# Patient Record
Sex: Female | Born: 2005 | Race: Black or African American | Hispanic: No | Marital: Single | Smoking: Never smoker
Health system: Southern US, Community
[De-identification: ages and names within clinical notes are randomized; demographics above are authoritative.]

## PROBLEM LIST (undated history)

## (undated) DIAGNOSIS — D573 Sickle-cell trait: Secondary | ICD-10-CM

## (undated) DIAGNOSIS — H5509 Other forms of nystagmus: Secondary | ICD-10-CM

---
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
## 2024-01-03 ENCOUNTER — Ambulatory Visit (HOSPITAL_COMMUNITY)
Admission: EM | Admit: 2024-01-03 | Discharge: 2024-01-03 | Disposition: A | Discharge status: Home / Self Care | Payer: Medicaid Other | Attending: Family Medicine | Admitting: Family Medicine

## 2024-01-03 ENCOUNTER — Encounter (HOSPITAL_COMMUNITY): Payer: Self-pay | Admitting: Emergency Medicine

## 2024-01-03 DIAGNOSIS — S61210A Laceration without foreign body of right index finger without damage to nail, initial encounter: Secondary | ICD-10-CM

## 2024-01-03 DIAGNOSIS — Z203 Contact with and (suspected) exposure to rabies: Secondary | ICD-10-CM | POA: Diagnosis not present

## 2024-01-03 MED ORDER — TETANUS-DIPHTH-ACELL PERTUSSIS 5-2.5-18.5 LF-MCG/0.5 IM SUSY
PREFILLED_SYRINGE | INTRAMUSCULAR | Status: AC
  Filled 2024-01-03: qty 0.5

## 2024-01-03 MED ORDER — TETANUS-DIPHTH-ACELL PERTUSSIS 5-2.5-18.5 LF-MCG/0.5 IM SUSY
0.5000 mL | PREFILLED_SYRINGE | Freq: Once | INTRAMUSCULAR | Status: AC
  Administered 2024-01-03: 0.5 mL via INTRAMUSCULAR

## 2024-01-03 MED ORDER — TETANUS-DIPHTHERIA TOXOIDS TD 5-2 LFU IM INJ: 0.5000 mL | INJECTION | Freq: Once | INTRAMUSCULAR | Status: DC

## 2024-01-03 NOTE — Discharge Instructions (Addendum)
 Keep the wound dry and clean.  You can change the overlying bandage every 24 hours.  Leave the Steri-Strips on until they fall off on their own.   If you see increased swelling, discharge, bleeding, redness around the finger, or red streaking up the hand return to the emergency room or ER.

## 2024-01-03 NOTE — ED Triage Notes (Signed)
 Pt cut right index finger on stair rail that is broken this morning. Band-aid on finger is controlling the bleeding at this time.

## 2024-01-03 NOTE — ED Provider Notes (Signed)
 [facility]    CSN: [id number] Arrival date & time: 01/03/24  1020      History   Chief Complaint Chief Complaint  Patient presents with   Laceration    HPI Meredith Webb is a 18 y.o. female.   The patient is a an 18 year old female presenting with a right index finger laceration.  She caught this on a metal railing today.  There has been no decreased range of motion due to weakness but rather due to pain.  Bleeding has been well-controlled with bandage.  She denies any distal numbness or weakness.  She does not remember the last time there was a tetanus shot.  The history is provided by the patient.  Laceration Associated symptoms: no fever     Past Medical History:  Diagnosis Date   Pendular nystagmus    Sickle cell trait (HCC)     There are no active problems to display for this patient.   History reviewed. No pertinent surgical history.  OB History   No obstetric history on file.      Home Medications    Prior to Admission medications   Medication Sig Start Date End Date Taking? Authorizing Provider  promethazine-dextromethorphan (PROMETHAZINE-DM) 6.25-15 MG/5ML syrup Take 5 mLs by mouth 4 (four) times daily as needed for cough. 09/20/23   Rising, Lurena Joiner PA-C    Family History No family history on file.  Social History Social History   Tobacco Use   Smoking status: Never   Smokeless tobacco: Never  Vaping Use   Vaping status: Never Used  Substance Use Topics   Alcohol use: Never   Drug use: Never     Allergies   Patient has no known allergies.   Review of Systems Review of Systems  Constitutional:  Negative for chills and fever.  Musculoskeletal:        Decreased ROM of the index finger due to pain but able to flex and extend  Skin:  Positive for wound. Negative for pallor.  Neurological:  Negative for weakness and numbness.     Physical Exam Triage Vital Signs ED Triage Vitals  Encounter Vitals Group     BP  01/03/24 1114 122/76     Systolic BP Percentile --      Diastolic BP Percentile --      Pulse Rate 01/03/24 1114 76     Resp 01/03/24 1114 15     Temp 01/03/24 1114 98.4 F (36.9 C)     Temp Source 01/03/24 1114 Oral     SpO2 01/03/24 1114 99 %     Weight --      Height --      Head Circumference --      Peak Flow --      Pain Score 01/03/24 1113 6     Pain Loc --      Pain Education --      Exclude from Growth Chart --    No data found.  Updated Vital Signs BP 122/76 (BP Location: Left Arm)   Pulse 76   Temp 98.4 F (36.9 C) (Oral)   Resp 15   LMP 12/13/2023   SpO2 99%   Visual Acuity Right Eye Distance:   Left Eye Distance:   Bilateral Distance:    Right Eye Near:   Left Eye Near:    Bilateral Near:     Physical Exam Vitals reviewed.  Constitutional:      General: She is not in acute  distress.    Appearance: Normal appearance. She is not ill-appearing or diaphoretic.  Musculoskeletal:     Comments: Right MCP, PIP, and DIP with full flexion and extension but elicits pain with flexion of the PIP No surrounding erythema, warmth, edema or drainage of the joint   Skin:    Capillary Refill: Capillary refill takes less than 2 seconds.     Comments: Right index finger reveals a 1/2 cm linear laceration over the medial aspect of the dorsal PIP joint through the epidermis but not involving the adipose layer  Neurological:     General: No focal deficit present.     Mental Status: She is alert.     Sensory: No sensory deficit.      UC Treatments / Results  Labs (all labs ordered are listed, but only abnormal results are displayed) Labs Reviewed - No data to display  EKG   Radiology No results found.  Procedures Procedures (including critical care time)  Medications Ordered in UC Medications  Tdap (BOOSTRIX) injection 0.5 mL (0.5 mLs Intramuscular Given 01/03/24 1206)    Initial Impression / Assessment and Plan / UC Course  I have reviewed the triage  vital signs and the nursing notes.  Pertinent labs & imaging results that were available during my care of the patient were reviewed by me and considered in my medical decision making (see chart for details).     Right index finger, medial sided, superficial laceration - The patient has full range of motion.  There is no compromise of the underlying tendons, ligaments, and it is only through the dermal layer. - The wound was cleaned today, Steri-Strips were placed, and an overlying bandage with Coban was placed. - Tetanus booster was administered.  The patient was given printed material for wound care and postvaccination. - Return criteria was discussed with the patient and mother. - The patient and mother voiced understanding and agreement with the plan.  All questions were answered.   Final Clinical Impressions(s) / UC Diagnoses   Final diagnoses:  Laceration of right index finger without foreign body without damage to nail, initial encounter     Discharge Instructions      Keep the wound dry and clean.  You can change the overlying bandage every 24 hours.  Leave the Steri-Strips on until they fall off on their own.   If you see increased swelling, discharge, bleeding, redness around the finger, or red streaking up the hand return to the emergency room or ER.    ED Prescriptions   None    PDMP not reviewed this encounter.   Ivor Messier, MD 01/03/24 [phone]

## 2024-10-14 ENCOUNTER — Encounter: Payer: Self-pay | Admitting: Family

## 2024-10-14 ENCOUNTER — Ambulatory Visit: Admitting: Family

## 2024-10-14 VITALS — BP 121/77 | HR 74 | Temp 98.5°F | Resp 16 | Ht 65.5 in | Wt 210.8 lb

## 2024-10-14 DIAGNOSIS — Z30011 Encounter for initial prescription of contraceptive pills: Secondary | ICD-10-CM | POA: Diagnosis not present

## 2024-10-14 DIAGNOSIS — F419 Anxiety disorder, unspecified: Secondary | ICD-10-CM

## 2024-10-14 DIAGNOSIS — F32A Depression, unspecified: Secondary | ICD-10-CM | POA: Diagnosis not present

## 2024-10-14 DIAGNOSIS — Z7689 Persons encountering health services in other specified circumstances: Secondary | ICD-10-CM

## 2024-10-14 LAB — POCT URINE PREGNANCY: Preg Test, Ur: NEGATIVE

## 2024-10-14 MED ORDER — NORGESTIMATE-ETH ESTRADIOL 0.25-35 MG-MCG PO TABS: 1.0000 | ORAL_TABLET | Freq: Every day | ORAL | 11 refills | Status: AC

## 2024-10-14 NOTE — Progress Notes (Signed)
 Subjective:    Meredith Webb - 18 y.o. female MRN [id number]  Date of birth: 04-17-2006  HPI  Meredith Webb is to establish care.  Current issues and/or concerns: - States would like to try birth control pill.  - Anxiety depression. States she is established with therapist. She denies thoughts of self-harm, suicidal ideations, homicidal ideations.  ROS per HPI   Health Maintenance:  Health Maintenance Due  Topic Date Due   HIV Screening  Never done   Hepatitis C Screening  Never done     Past Medical History: There are no active problems to display for this patient.     Social History   reports that she has never smoked. She has never used smokeless tobacco. She reports that she does not drink alcohol and does not use drugs.   Family History  family history is not on file.   Medications: reviewed and updated   Objective:   Physical Exam BP 121/77   Pulse 74   Temp 98.5 F (36.9 C) (Oral)   Resp 16   Ht 5' 5.5 (1.664 m)   Wt 210 lb 12.8 oz (95.6 kg)   LMP 09/17/2024 (Approximate)   SpO2 99%   BMI 34.55 kg/m   Physical Exam HENT:     Head: Normocephalic and atraumatic.     Nose: Nose normal.     Mouth/Throat:     Mouth: Mucous membranes are moist.     Pharynx: Oropharynx is clear.  Eyes:     Extraocular Movements: Extraocular movements intact.     Conjunctiva/sclera: Conjunctivae normal.     Pupils: Pupils are equal, round, and reactive to light.  Cardiovascular:     Rate and Rhythm: Normal rate and regular rhythm.     Pulses: Normal pulses.     Heart sounds: Normal heart sounds.  Pulmonary:     Effort: Pulmonary effort is normal.     Breath sounds: Normal breath sounds.  Musculoskeletal:        General: Normal range of motion.     Cervical back: Normal range of motion and neck supple.  Neurological:     General: No focal deficit present.     Mental Status: She is alert and oriented to person, place, and time.  Psychiatric:         Mood and Affect: Mood normal.        Behavior: Behavior normal.     Assessment & Plan:  1. Encounter to establish care (Primary) - Patient presents today to establish care. During the interim follow-up with primary provider as scheduled.  - Return for annual physical examination, labs, and health maintenance. Arrive fasting meaning having no food for at least 8 hours prior to appointment. You may have only water or black coffee. Please take scheduled medications as normal.  2. Encounter for initial prescription of contraceptive pills - Estarylla as prescribed. Counseled on medication adherence/adverse effects. - Routine screening.  - Follow-up with primary provider as scheduled.  - POCT urine pregnancy; Future - norgestimate-ethinyl estradiol (ESTARYLLA) 0.25-35 MG-MCG tablet; Take 1 tablet by mouth daily.  Dispense: 28 tablet; Refill: 11 - POCT urine pregnancy  3. Anxiety and depression - Patient denies thoughts of self-harm, suicidal ideations, homicidal ideations. - Patient declined pharmacological therapy.  - Keep all scheduled appointments with established therapist.  - Follow-up with primary provider as scheduled.    Patient was given clear instructions to go to Emergency Department or return to medical center if symptoms  don't improve, worsen, or new problems develop.The patient verbalized understanding.  I discussed the assessment and treatment plan with the patient. The patient was provided an opportunity to ask questions and all were answered. The patient agreed with the plan and demonstrated an understanding of the instructions.   The patient was advised to call back or seek an in-person evaluation if the symptoms worsen or if the condition fails to improve as anticipated.    Greig Chute, NP 10/14/2024, 2:50 PM Primary [facility] at [facility]

## 2024-10-14 NOTE — Progress Notes (Signed)
 Patient scored a 17 on the PHQ-9,  Patient scored a 13 on GAD-7

## 2024-10-15 ENCOUNTER — Ambulatory Visit: Payer: Self-pay | Admitting: Family

## 2025-06-16 ENCOUNTER — Encounter: Admitting: Family
